# Patient Record
Sex: Male | Born: 2000 | Race: Black or African American | Hispanic: No | Marital: Single | Smoking: Never smoker
Health system: Southern US, Community
[De-identification: ages and names within clinical notes are randomized; demographics above are authoritative.]

## PROBLEM LIST (undated history)

## (undated) DIAGNOSIS — D571 Sickle-cell disease without crisis: Secondary | ICD-10-CM

---

## 2019-02-20 ENCOUNTER — Emergency Department (HOSPITAL_COMMUNITY)
Admission: EM | Admit: 2019-02-20 | Discharge: 2019-02-20 | Disposition: A | Discharge status: Home / Self Care | Attending: Emergency Medicine | Admitting: Emergency Medicine

## 2019-02-20 ENCOUNTER — Emergency Department (HOSPITAL_COMMUNITY)

## 2019-02-20 ENCOUNTER — Other Ambulatory Visit: Payer: Self-pay

## 2019-02-20 ENCOUNTER — Encounter (HOSPITAL_COMMUNITY): Payer: Self-pay | Admitting: Emergency Medicine

## 2019-02-20 DIAGNOSIS — R079 Chest pain, unspecified: Secondary | ICD-10-CM

## 2019-02-20 DIAGNOSIS — D571 Sickle-cell disease without crisis: Secondary | ICD-10-CM | POA: Diagnosis not present

## 2019-02-20 DIAGNOSIS — R0789 Other chest pain: Secondary | ICD-10-CM | POA: Insufficient documentation

## 2019-02-20 HISTORY — DX: Sickle-cell disease without crisis: D57.1

## 2019-02-20 LAB — TROPONIN I (HIGH SENSITIVITY)
Troponin I (High Sensitivity): 2 ng/L (ref <18)
Troponin I (High Sensitivity): 2 ng/L (ref <18)

## 2019-02-20 LAB — BASIC METABOLIC PANEL
Anion gap: 10 (ref 5–15)
BUN: 13 mg/dL (ref 6–20)
CO2: 27 mmol/L (ref 22–32)
Calcium: 9.2 mg/dL (ref 8.9–10.3)
Chloride: 103 mmol/L (ref 98–111)
Creatinine, Ser: 0.9 mg/dL (ref 0.61–1.24)
GFR calc Af Amer: >60 mL/min (ref >60)
GFR calc non Af Amer: >60 mL/min (ref >60)
Glucose, Bld: 95 mg/dL (ref 70–99)
Potassium: 4.3 mmol/L (ref 3.5–5.1)
Sodium: 140 mmol/L (ref 135–145)

## 2019-02-20 LAB — CBC
HCT: 39.5 % (ref 39.0–52.0)
Hemoglobin: 13 g/dL (ref 13.0–17.0)
MCH: 22.1 pg — ABNORMAL LOW (ref 26.0–34.0)
MCHC: 32.9 g/dL (ref 30.0–36.0)
MCV: 67.3 fL — ABNORMAL LOW (ref 80.0–100.0)
Platelets: 168 10*3/uL (ref 150–400)
RBC: 5.87 MIL/uL — ABNORMAL HIGH (ref 4.22–5.81)
RDW: 17.3 % — ABNORMAL HIGH (ref 11.5–15.5)
WBC: 6.8 10*3/uL (ref 4.0–10.5)
nRBC: 0 % (ref 0.0–0.2)

## 2019-02-20 MED ORDER — SODIUM CHLORIDE 0.9% FLUSH: 3.0000 mL | Freq: Once | INTRAVENOUS | Status: DC

## 2019-02-20 NOTE — ED Notes (Signed)
Pt arrives to ED with c/c of CP also reports hx of sickle cell and over 14 days ago was tested + for covid.

## 2019-02-20 NOTE — ED Provider Notes (Signed)
[facility] EMERGENCY DEPARTMENT Provider Note   CSN: [id number] Arrival date & time: 02/20/19  1530     History Chief Complaint  Patient presents with  . Chest Pain    Marcus Ross is a 19 y.o. male.  HPI 19 year old male with chest pain.  Onset yesterday.  He cannot remember to specifically doing when he first noticed it.  The pain is in the center of his chest.  Describes it as sharp.  Sometimes worse with deep breaths and certain movements.  He does not feel short of breath though.  No fevers or chills.  No cough.  No unusual leg pain or swelling.  He has a past history of sickle cell anemia.  Denies any past history of DVT/PE.  Past Medical History:  Diagnosis Date  . Sickle cell anemia (HCC)     No family history on file.  Social History   Tobacco Use  . Smoking status: Not on file  Substance Use Topics  . Alcohol use: Not on file  . Drug use: Not on file   Home Medications Prior to Admission medications   Not on File   Allergies    Patient has no known allergies.  Review of Systems   Review of Systems All systems reviewed and negative, other than as noted in HPI.  Physical Exam Updated Vital Signs BP 122/80   Pulse (!) 54   Temp 98 F (36.7 C) (Oral)   Resp (!) 22   SpO2 100%   Physical Exam Vitals and nursing note reviewed.  Constitutional:      General: He is not in acute distress.    Appearance: He is well-developed.  HENT:     Head: Normocephalic and atraumatic.  Eyes:     General:        Right eye: No discharge.        Left eye: No discharge.     Conjunctiva/sclera: Conjunctivae normal.  Cardiovascular:     Rate and Rhythm: Normal rate and regular rhythm.     Heart sounds: Normal heart sounds. No murmur. No friction rub. No gallop.   Pulmonary:     Effort: Pulmonary effort is normal. No respiratory distress.     Breath sounds: Normal breath sounds.  Abdominal:     General: There is no distension.     Palpations:  Abdomen is soft.     Tenderness: There is no abdominal tenderness.  Musculoskeletal:        General: No tenderness.     Cervical back: Neck supple.  Skin:    General: Skin is warm and dry.  Neurological:     Mental Status: He is alert.  Psychiatric:        Behavior: Behavior normal.        Thought Content: Thought content normal.    ED Results / Procedures / Treatments   Labs (all labs ordered are listed, but only abnormal results are displayed) Labs Reviewed  CBC - Abnormal; Notable for the following components:      Result Value   RBC 5.87 (*)    MCV 67.3 (*)    MCH 22.1 (*)    RDW 17.3 (*)    All other components within normal limits  BASIC METABOLIC PANEL  TROPONIN I (HIGH SENSITIVITY)  TROPONIN I (HIGH SENSITIVITY)   EKG EKG Interpretation  Date/Time:  Tuesday February 20 2019 16:17:09 EST Ventricular Rate:  59 PR Interval:  142 QRS Duration: 86 QT Interval:  410 QTC Calculation: 405 R Axis:   95 Text Interpretation: Sinus bradycardia with marked sinus arrhythmia Rightward axis Borderline ECG Confirmed by Raeford Razor [phone]) on 02/20/2019 4:50:13 PM   Radiology DG Chest 2 View  Result Date: 02/20/2019 CLINICAL DATA:  Chest pain EXAM: CHEST - 2 VIEW COMPARISON:  None. FINDINGS: The heart size and mediastinal contours are within normal limits. Both lungs are clear. The visualized skeletal structures are unremarkable. IMPRESSION: No active cardiopulmonary disease. Electronically Signed   By: Jonna Clark M.D.   On: 02/20/2019 16:28   Procedures Procedures (including critical care time)  Medications Ordered in ED Medications  sodium chloride flush (NS) 0.9 % injection 3 mL (has no administration in time range)   ED Course  I have reviewed the triage vital signs and the nursing notes.  Pertinent labs & imaging results that were available during my care of the patient were reviewed by me and considered in my medical decision making (see chart for details).     MDM Rules/Calculators/A&P                      19 year old male with chest pain.  I suspect that this is musculoskeletal/chest wall pain.  It is reproducible with palpation and certain movements.  I do not think that this is acute chest or other emergent condition related to his sickle cell anemia or otherwise.  Very atypical for ACS.  Doubt PE.  He has no acute respiratory complaints.  He sounds clear on exam.  O2 sats are normal on room air.  His chest x-ray is clear.  Labs are unremarkable.  Advised taking NSAIDs as needed for discomfort.  Discussed results with his mother as well via the telephone.  Emergent return precautions were discussed with both of them.  Outpatient follow-up otherwise.  Final Clinical Impression(s) / ED Diagnoses Final diagnoses:  Chest pain, unspecified type    Rx / DC Orders ED Discharge Orders    None       Raeford Razor, MD 02/25/19 1423

## 2019-06-01 ENCOUNTER — Emergency Department (HOSPITAL_COMMUNITY)

## 2019-06-01 ENCOUNTER — Encounter (HOSPITAL_COMMUNITY): Payer: Self-pay | Admitting: Emergency Medicine

## 2019-06-01 ENCOUNTER — Other Ambulatory Visit: Payer: Self-pay

## 2019-06-01 ENCOUNTER — Emergency Department (HOSPITAL_COMMUNITY)
Admission: EM | Admit: 2019-06-01 | Discharge: 2019-06-01 | Disposition: A | Discharge status: Home / Self Care | Attending: Emergency Medicine | Admitting: Emergency Medicine

## 2019-06-01 DIAGNOSIS — R1012 Left upper quadrant pain: Secondary | ICD-10-CM | POA: Diagnosis present

## 2019-06-01 DIAGNOSIS — D571 Sickle-cell disease without crisis: Secondary | ICD-10-CM | POA: Insufficient documentation

## 2019-06-01 DIAGNOSIS — D735 Infarction of spleen: Secondary | ICD-10-CM | POA: Insufficient documentation

## 2019-06-01 LAB — COMPREHENSIVE METABOLIC PANEL
ALT: 72 U/L — ABNORMAL HIGH (ref 0–44)
AST: 75 U/L — ABNORMAL HIGH (ref 15–41)
Albumin: 4.7 g/dL (ref 3.5–5.0)
Alkaline Phosphatase: 60 U/L (ref 38–126)
Anion gap: 16 — ABNORMAL HIGH (ref 5–15)
BUN: 16 mg/dL (ref 6–20)
CO2: 20 mmol/L — ABNORMAL LOW (ref 22–32)
Calcium: 9.6 mg/dL (ref 8.9–10.3)
Chloride: 107 mmol/L (ref 98–111)
Creatinine, Ser: 0.9 mg/dL (ref 0.61–1.24)
GFR calc Af Amer: >60 mL/min (ref >60)
GFR calc non Af Amer: >60 mL/min (ref >60)
Glucose, Bld: 61 mg/dL — ABNORMAL LOW (ref 70–99)
Potassium: 3.9 mmol/L (ref 3.5–5.1)
Sodium: 143 mmol/L (ref 135–145)
Total Bilirubin: 1.4 mg/dL — ABNORMAL HIGH (ref 0.3–1.2)
Total Protein: 7.8 g/dL (ref 6.5–8.1)

## 2019-06-01 LAB — URINALYSIS, ROUTINE W REFLEX MICROSCOPIC
Bilirubin Urine: NEGATIVE
Glucose, UA: NEGATIVE mg/dL
Hgb urine dipstick: NEGATIVE
Ketones, ur: 5 mg/dL — AB
Leukocytes,Ua: NEGATIVE
Nitrite: NEGATIVE
Protein, ur: NEGATIVE mg/dL
Specific Gravity, Urine: 1.014 (ref 1.005–1.030)
pH: 5 (ref 5.0–8.0)

## 2019-06-01 LAB — LIPASE, BLOOD: Lipase: 22 U/L (ref 11–51)

## 2019-06-01 LAB — CBC
HCT: 43.4 % (ref 39.0–52.0)
Hemoglobin: 14.6 g/dL (ref 13.0–17.0)
MCH: 22.9 pg — ABNORMAL LOW (ref 26.0–34.0)
MCHC: 33.6 g/dL (ref 30.0–36.0)
MCV: 68.1 fL — ABNORMAL LOW (ref 80.0–100.0)
Platelets: 165 10*3/uL (ref 150–400)
RBC: 6.37 MIL/uL — ABNORMAL HIGH (ref 4.22–5.81)
RDW: 17.2 % — ABNORMAL HIGH (ref 11.5–15.5)
WBC: 8.6 10*3/uL (ref 4.0–10.5)
nRBC: 0 % (ref 0.0–0.2)

## 2019-06-01 MED ORDER — ALUM & MAG HYDROXIDE-SIMETH 200-200-20 MG/5ML PO SUSP
30.0000 mL | Freq: Once | ORAL | Status: AC
  Administered 2019-06-01: 30 mL via ORAL
  Filled 2019-06-01: qty 30

## 2019-06-01 MED ORDER — LIDOCAINE VISCOUS HCL 2 % MT SOLN
15.0000 mL | Freq: Once | OROMUCOSAL | Status: AC
  Administered 2019-06-01: 15 mL via ORAL
  Filled 2019-06-01: qty 15

## 2019-06-01 MED ORDER — KETOROLAC TROMETHAMINE 15 MG/ML IJ SOLN
15.0000 mg | Freq: Once | INTRAMUSCULAR | Status: AC
  Administered 2019-06-01: 15 mg via INTRAVENOUS
  Filled 2019-06-01: qty 1

## 2019-06-01 MED ORDER — KETOROLAC TROMETHAMINE 60 MG/2ML IM SOLN
60.0000 mg | Freq: Once | INTRAMUSCULAR | Status: DC
  Filled 2019-06-01: qty 2

## 2019-06-01 MED ORDER — IOHEXOL 300 MG/ML  SOLN
100.0000 mL | Freq: Once | INTRAMUSCULAR | Status: AC | PRN
  Administered 2019-06-01: 100 mL via INTRAVENOUS

## 2019-06-01 MED ORDER — NAPROXEN 500 MG PO TABS: 500.0000 mg | ORAL_TABLET | Freq: Two times a day (BID) | ORAL | 0 refills | Status: DC

## 2019-06-01 NOTE — Discharge Instructions (Addendum)
You are seen today for abdominal pain.  He looks like there is a small splenic infarct on your CT scan causing your pain.  Otherwise your labs are reassuring.  Follow-up with your hematologist on Monday as scheduled.  Thank you for allowing me to care for you today. Please return to the emergency department if you have new or worsening symptoms. Take your medications as instructed.

## 2019-06-01 NOTE — ED Notes (Signed)
Called pt name x3 for VS recheck. No response from pt. Previously pt family member came and said she was going to take him to another facility. Encouraged to stay and get seen here.

## 2019-06-01 NOTE — ED Notes (Signed)
Carmel Sacramento is the pt's hematologist.

## 2019-06-01 NOTE — ED Triage Notes (Signed)
Patient c/o left upper abdominal pain/ below rib cage pain onset of Tuesday. Pain worse with deep inspiration. Reports left shoulder pain intermittently. Denies any chest pain.

## 2019-06-01 NOTE — ED Provider Notes (Addendum)
[facility] EMERGENCY DEPARTMENT Provider Note   CSN: [id number] Arrival date & time: 06/01/19  5573     History Chief Complaint  Patient presents with  . Abdominal Pain    Left upper    Marcus Ross is a 19 y.o. male.  19 year old male with past medical history of sickle cell anemia presenting to the emergency department for left upper quadrant pain for the past 3 or 4 days.  Pain is worse with deep breaths and palpation.  He is eating and drinking normally.  Denies any chest pain, shortness of breath, nausea, vomiting.  The pain occasionally radiates to the left shoulder.  He reports it feels like his spleen.  He does not have frequent pain crisis he is and does not regularly take medication for his sickle cell.        Past Medical History:  Diagnosis Date  . Sickle cell anemia (HCC)     There are no problems to display for this patient.   History reviewed. No pertinent surgical history.     No family history on file.  Social History   Tobacco Use  . Smoking status: Never Smoker  Substance Use Topics  . Alcohol use: Yes  . Drug use: Not on file    Home Medications Prior to Admission medications   Medication Sig Start Date End Date Taking? Authorizing Provider  naproxen (NAPROSYN) 500 MG tablet Take 1 tablet (500 mg total) by mouth 2 (two) times daily. 06/01/19   Alveria Apley, PA-C    Allergies    Patient has no known allergies.  Review of Systems   Review of Systems  Constitutional: Negative for chills and fever.  HENT: Negative for congestion, sore throat and tinnitus.   Respiratory: Negative for cough and shortness of breath.   Cardiovascular: Negative for chest pain.  Gastrointestinal: Positive for abdominal pain. Negative for constipation, diarrhea, nausea and rectal pain.  Genitourinary: Negative for dysuria.  Musculoskeletal: Positive for arthralgias.  Neurological: Negative for dizziness, weakness and light-headedness.     Physical Exam Updated Vital Signs BP 128/70 (BP Location: Left Arm)   Pulse 69   Temp 98.4 F (36.9 C) (Oral)   Resp 18   Ht 5\' 9"  (1.753 m)   Wt 63.5 kg   SpO2 99%   BMI 20.67 kg/m   Physical Exam Vitals and nursing note reviewed.  Constitutional:      Appearance: Normal appearance.  HENT:     Head: Normocephalic.  Eyes:     Conjunctiva/sclera: Conjunctivae normal.  Pulmonary:     Effort: Pulmonary effort is normal.  Abdominal:     Palpations: There is no hepatomegaly or splenomegaly.     Tenderness: There is abdominal tenderness in the left upper quadrant. There is no right CVA tenderness, left CVA tenderness, guarding or rebound.  Skin:    General: Skin is dry.  Neurological:     Mental Status: He is alert.  Psychiatric:        Mood and Affect: Mood normal.     ED Results / Procedures / Treatments   Labs (all labs ordered are listed, but only abnormal results are displayed) Labs Reviewed  COMPREHENSIVE METABOLIC PANEL - Abnormal; Notable for the following components:      Result Value   CO2 20 (*)    Glucose, Bld 61 (*)    AST 75 (*)    ALT 72 (*)    Total Bilirubin 1.4 (*)    Anion  gap 16 (*)    All other components within normal limits  CBC - Abnormal; Notable for the following components:   RBC 6.37 (*)    MCV 68.1 (*)    MCH 22.9 (*)    RDW 17.2 (*)    All other components within normal limits  URINALYSIS, ROUTINE W REFLEX MICROSCOPIC - Abnormal; Notable for the following components:   Ketones, ur 5 (*)    All other components within normal limits  LIPASE, BLOOD    EKG EKG Interpretation  Date/Time:  Friday Jun 01 2019 08:28:34 EDT Ventricular Rate:  67 PR Interval:  132 QRS Duration: 90 QT Interval:  418 QTC Calculation: 441 R Axis:   91 Text Interpretation: Sinus rhythm with marked sinus arrhythmia Rightward axis No significant change since last tracing Confirmed by Gwyneth Sprout ([id number]) on 06/01/2019 12:21:13 PM   Radiology DG  Chest 2 View  Result Date: 06/01/2019 CLINICAL DATA:  Left lower rib pain EXAM: CHEST - 2 VIEW COMPARISON:  02/20/2019 FINDINGS: The heart size and mediastinal contours are within normal limits. Both lungs are clear. The visualized skeletal structures are unremarkable. IMPRESSION: No active cardiopulmonary disease. Electronically Signed   By: Elige Ko   On: 06/01/2019 12:00   CT ABDOMEN PELVIS W CONTRAST  Result Date: 06/01/2019 CLINICAL DATA:  Left upper quadrant abdominal pain. Sickle cell disorder. EXAM: CT ABDOMEN AND PELVIS WITH CONTRAST TECHNIQUE: Multidetector CT imaging of the abdomen and pelvis was performed using the standard protocol following bolus administration of intravenous contrast. CONTRAST:  OMNIPAQUE IOHEXOL 300 MG/ML  SOLN COMPARISON:  None. FINDINGS: Lower chest: Clear lung bases. No significant pleural or pericardial effusion. Hepatobiliary: The liver is normal in density without suspicious focal abnormality. No evidence of gallstones, gallbladder wall thickening or biliary dilatation. Pancreas: Unremarkable. No pancreatic ductal dilatation or surrounding inflammatory changes. Spleen: Measures 11.9 x 6.0 x 8.1 cm (volume = 300 cm^3), within normal limits for size. There is a small peripheral low-density area along the inferior aspect of the spleen which is most obvious on axial images 22 through 24 of series 3. Greatest dimension is approximately 3.0 cm on image 91/7. In this clinical context, this likely represents a small splenic infarct. No surrounding inflammatory changes. The spleen otherwise appears normal without calcifications. Adrenals/Urinary Tract: Both adrenal glands appear normal. The kidneys appear normal without evidence of urinary tract calculus, suspicious lesion or hydronephrosis. No bladder abnormalities are seen. Stomach/Bowel: No evidence of bowel wall thickening, distention or surrounding inflammatory change. Moderate stool throughout the colon. The  appendix is not well visualized. Vascular/Lymphatic: There are no enlarged abdominal or pelvic lymph nodes. No significant vascular findings. Circumaortic left renal vein noted incidentally. Reproductive: The prostate gland and seminal vesicles appear normal. Other: No ascites or free air. Musculoskeletal: No acute or significant osseous findings. No evidence of bone infarct. IMPRESSION: 1. 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 No splenomegaly or surrounding inflammatory changes. 2. No other significant findings.  No free peritoneal fluid. Electronically Signed   By: Carey Bullocks M.D.   On: 06/01/2019 16:08    Procedures Procedures (including critical care time)  Medications Ordered in ED Medications  alum & mag hydroxide-simeth (MAALOX/MYLANTA) 200-200-20 MG/5ML suspension 30 mL (30 mLs Oral Given 06/01/19 1222)    And  lidocaine (XYLOCAINE) 2 % viscous mouth solution 15 mL (15 mLs Oral Given 06/01/19 1222)  iohexol (OMNIPAQUE) 300 MG/ML solution 100 mL (100 mLs Intravenous Contrast Given 06/01/19 1525)  ketorolac (TORADOL) 15 MG/ML injection  15 mg (15 mg Intravenous Given 06/01/19 1636)    ED Course  I have reviewed the triage vital signs and the nursing notes.  Pertinent labs & imaging results that were available during my care of the patient were reviewed by me and considered in my medical decision making (see chart for details).  Clinical Course as of Jun 01 2106  Fri Jun 01, 2019  1621 Sickle cell patient with LUQ pain for 4 days. Overall well appearing, normal hemoglobin. Small splenic infarct on CT scan which is likely the cause of his pain.  Discussed with patient and his mother at bedside.  Will treat with NSAIDs and he has hematology follow-up on Monday. Advised on return precaution    [KM]    Clinical Course User Index [KM] Jeral Pinch   MDM Rules/Calculators/A&P                      Based on review of vitals, medical screening exam, lab work and/or imaging,  there does not appear to be an acute, emergent etiology for the patient's symptoms. Counseled pt on good return precautions and encouraged both PCP and ED follow-up as needed.  Prior to discharge, I also discussed incidental imaging findings with patient in detail and advised appropriate, recommended follow-up in detail.  Clinical Impression: 1. 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 The patient had no recent or regular prescriptions and was deemed appropriate for a brief, less than 3 day prescription of narcotic for acute analgesia.  This note was prepared with assistance of Conservation officer, historic buildings. Occasional wrong-word or sound-a-like substitutions may have occurred due to the inherent limitations of voice recognition software.  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

## 2019-06-01 NOTE — ED Notes (Signed)
Pt ambulatory to restroom now 

## 2020-12-17 ENCOUNTER — Ambulatory Visit (HOSPITAL_COMMUNITY)
Admission: EM | Admit: 2020-12-17 | Discharge: 2020-12-17 | Disposition: A | Discharge status: Home / Self Care | Attending: Urgent Care | Admitting: Urgent Care

## 2020-12-17 ENCOUNTER — Encounter (HOSPITAL_COMMUNITY): Payer: Self-pay | Admitting: *Deleted

## 2020-12-17 ENCOUNTER — Other Ambulatory Visit: Payer: Self-pay

## 2020-12-17 DIAGNOSIS — K219 Gastro-esophageal reflux disease without esophagitis: Secondary | ICD-10-CM

## 2020-12-17 DIAGNOSIS — K529 Noninfective gastroenteritis and colitis, unspecified: Secondary | ICD-10-CM

## 2020-12-17 DIAGNOSIS — R1010 Upper abdominal pain, unspecified: Secondary | ICD-10-CM

## 2020-12-17 DIAGNOSIS — R112 Nausea with vomiting, unspecified: Secondary | ICD-10-CM

## 2020-12-17 MED ORDER — FAMOTIDINE 20 MG PO TABS: 20.0000 mg | ORAL_TABLET | Freq: Two times a day (BID) | ORAL | 0 refills | Status: AC

## 2020-12-17 MED ORDER — ONDANSETRON 8 MG PO TBDP: 8.0000 mg | ORAL_TABLET | Freq: Three times a day (TID) | ORAL | 0 refills | Status: DC | PRN

## 2020-12-17 NOTE — Discharge Instructions (Signed)
Make sure you push fluids drinking mostly water but mix it with Gatorade.  Try to eat light meals including soups, broths and soft foods, fruits.  You may use Zofran for your nausea and vomiting once every 8 hours.  Imodium can help with diarrhea but use this carefully limiting it to 1-2 times per day only if you are having a lot of diarrhea.  Please return to the clinic if symptoms worsen or you start having severe abdominal pain not helped by taking Tylenol or start having bloody stools or blood in the vomit.  For you acid reflux continue taking omeprazole. I am prescribing famotidine to use with this as well.

## 2020-12-17 NOTE — ED Triage Notes (Signed)
Pt reports ABD pain and vomiting since last night

## 2020-12-17 NOTE — ED Provider Notes (Signed)
[facility]   MRN: [id number] DOB: 05/02/2000  Subjective:   Marcus Ross is a 20 y.o. male presenting for 1 day history of acute onset nausea with vomiting, upper abdominal pain. States that this is consistent with his acid reflux. No fever, hematemesis, bloody stools, diarrhea, cough, chest pain, shob, body aches. Needs a note for work.  No recent antibiotic use, hospitalizations or long distance travel outside the country.  Has get dietary routine the same.  No current facility-administered medications for this encounter.  Current Outpatient Medications:    naproxen (NAPROSYN) 500 MG tablet, Take 1 tablet (500 mg total) by mouth 2 (two) times daily., Disp: 30 tablet, Rfl: 0   No Known Allergies  Past Medical History:  Diagnosis Date   Sickle cell anemia (HCC)      History reviewed. No pertinent surgical history.  History reviewed. No pertinent family history.  Social History   Tobacco Use   Smoking status: Never  Substance Use Topics   Alcohol use: Yes    ROS   Objective:   Vitals: BP 107/71   Pulse (!) 52   Temp 98.3 F (36.8 C)   Resp 20   SpO2 100%   Physical Exam Constitutional:      General: He is not in acute distress.    Appearance: Normal appearance. He is well-developed and normal weight. He is not ill-appearing, toxic-appearing or diaphoretic.  HENT:     Head: Normocephalic and atraumatic.     Right Ear: External ear normal.     Left Ear: External ear normal.     Nose: Nose normal.     Mouth/Throat:     Mouth: Mucous membranes are moist.     Pharynx: Oropharynx is clear.  Eyes:     General: No scleral icterus.       Right eye: No discharge.        Left eye: No discharge.     Extraocular Movements: Extraocular movements intact.     Pupils: Pupils are equal, round, and reactive to light.  Cardiovascular:     Rate and Rhythm: Normal rate and regular rhythm.     Heart sounds: Normal heart sounds. No murmur heard.   No  friction rub. No gallop.  Pulmonary:     Effort: Pulmonary effort is normal. No respiratory distress.     Breath sounds: Normal breath sounds. No stridor. No wheezing, rhonchi or rales.  Abdominal:     General: Bowel sounds are normal. There is no distension.     Palpations: Abdomen is soft. There is no mass.     Tenderness: There is no abdominal tenderness. There is no right CVA tenderness, left CVA tenderness, guarding or rebound.  Musculoskeletal:     Cervical back: Normal range of motion.  Neurological:     Mental Status: He is alert and oriented to person, place, and time.  Psychiatric:        Mood and Affect: Mood normal.        Behavior: Behavior normal.        Thought Content: Thought content normal.        Judgment: Judgment normal.    Assessment and Plan :   PDMP not reviewed this encounter.  1. Gastroenteritis   2. Nausea and vomiting, unspecified vomiting type   3. Pain of upper abdomen   4. Gastroesophageal reflux disease, unspecified whether esophagitis present    Will manage for suspected viral gastroenteritis with supportive care.  Recommended patient hydrate well, eat light meals and maintain electrolytes.  Will use Zofran for nausea, vomiting. Maintain omeprazole, add famotidine.  Counseled patient on potential for adverse effects with medications prescribed/recommended today, ER and return-to-clinic precautions discussed, patient verbalized understanding.    Wallis Bamberg, PA-C 12/17/20 1124

## 2021-09-10 IMAGING — CT CT ABD-PELV W/ CM
2 of 4 series · 15 of 46 positions shown, 17 images · IV contrast (omnipaque)
Comparison: None.

CLINICAL DATA: Left upper quadrant abdominal pain. Sickle cell
disorder.

EXAM:
CT ABDOMEN AND PELVIS WITH CONTRAST
TECHNIQUE: Multidetector CT imaging of the abdomen and pelvis was performed
using the standard protocol following bolus administration of
intravenous contrast.
CONTRAST:  100mL OMNIPAQUE IOHEXOL 300 MG/ML  SOLN

[Series 3: abdomen 5.0 · axial · 0.62mm/px · z∈[+747,+1112]mm · 12 of 83 slices shown, 14 images]
[im 5/83  soft-tissue]
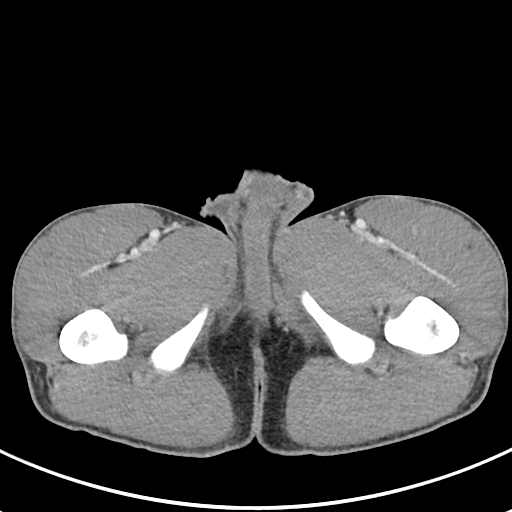
[im 5/83  bone]
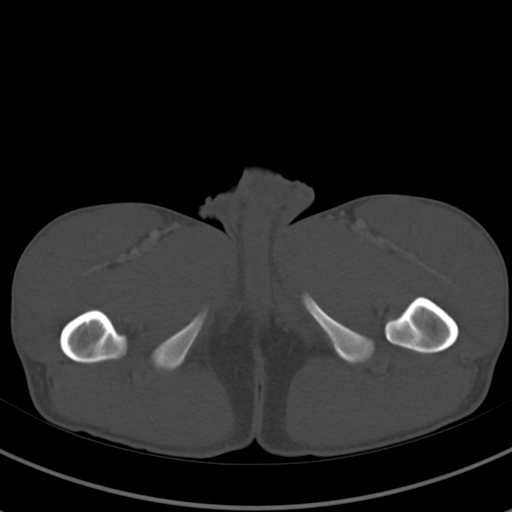
[im 15/83  soft-tissue]
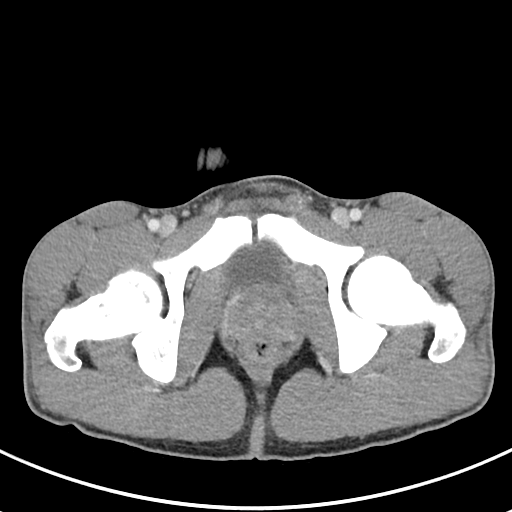
[im 20/83  soft-tissue]
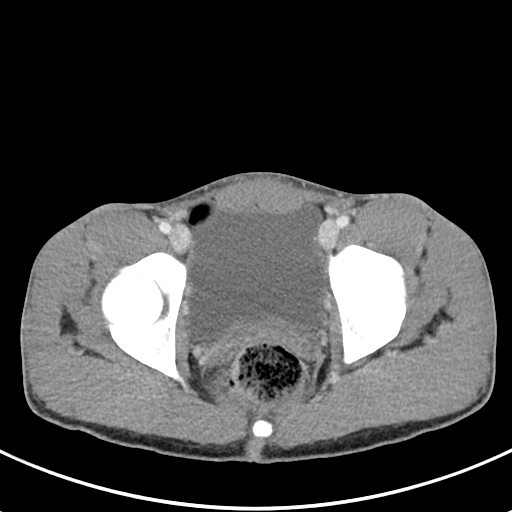
[im 25/83  soft-tissue]
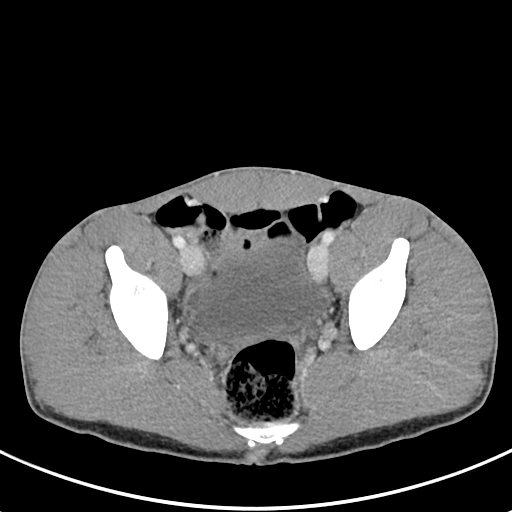
[im 34/83  soft-tissue]
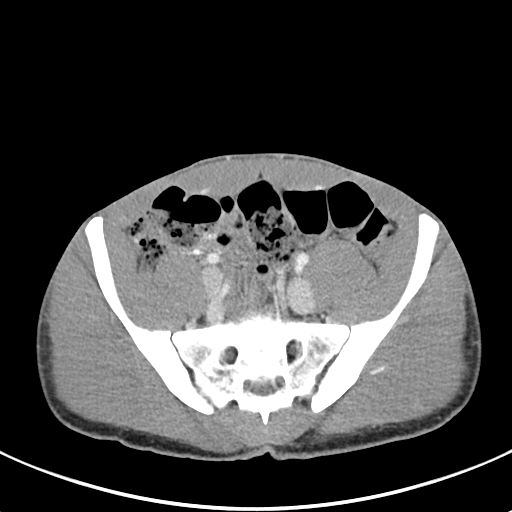
[im 39/83  soft-tissue]
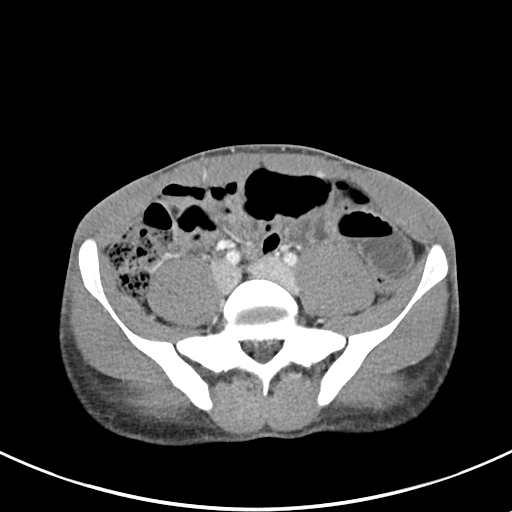
[im 44/83  soft-tissue]
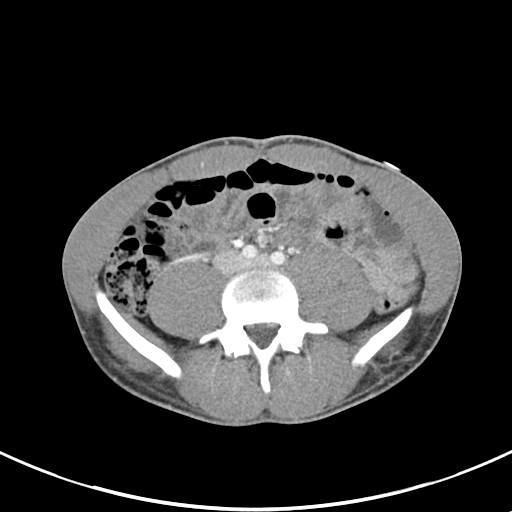
[im 54/83  soft-tissue]
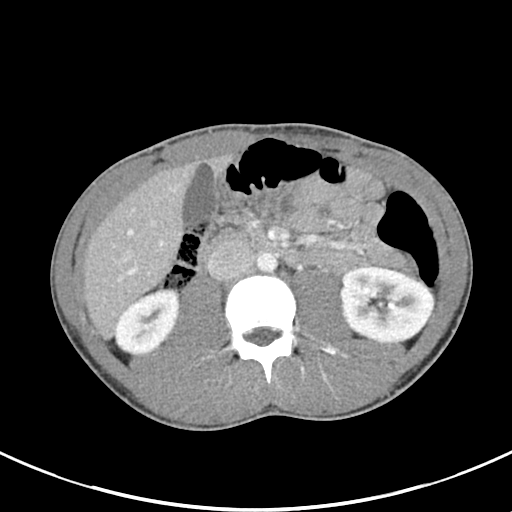
[im 58/83  soft-tissue]
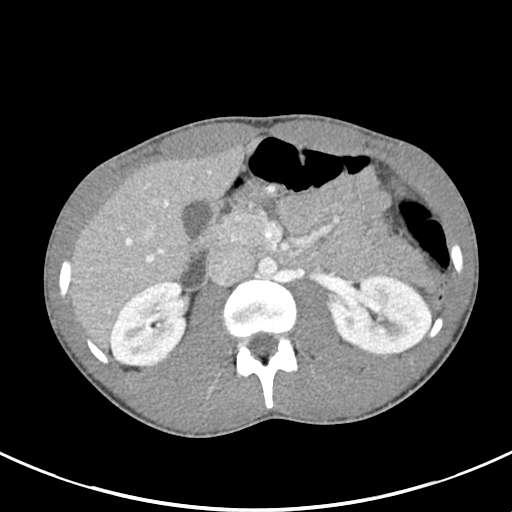
[im 58/83  bone]
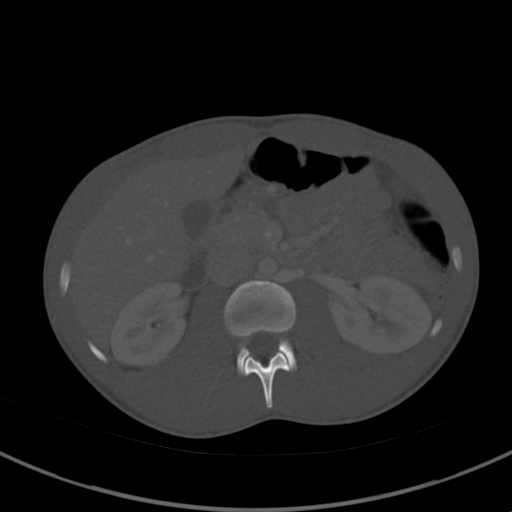
[im 63/83  soft-tissue]
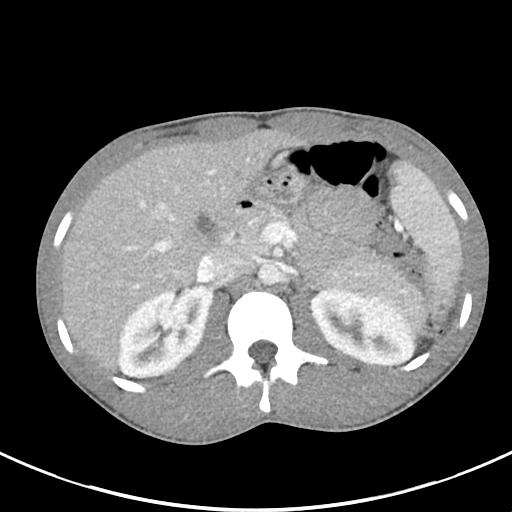
[im 73/83  soft-tissue]
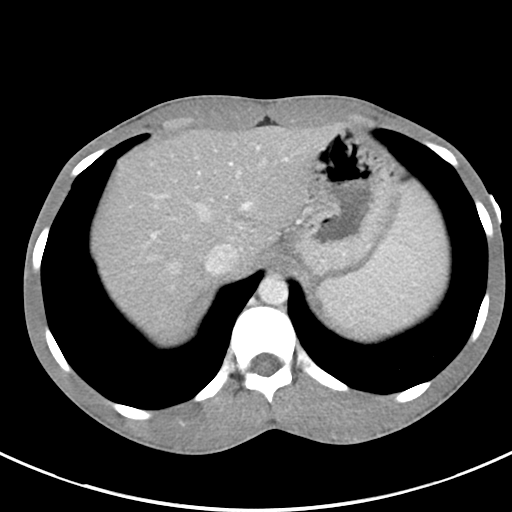
[im 78/83  soft-tissue]
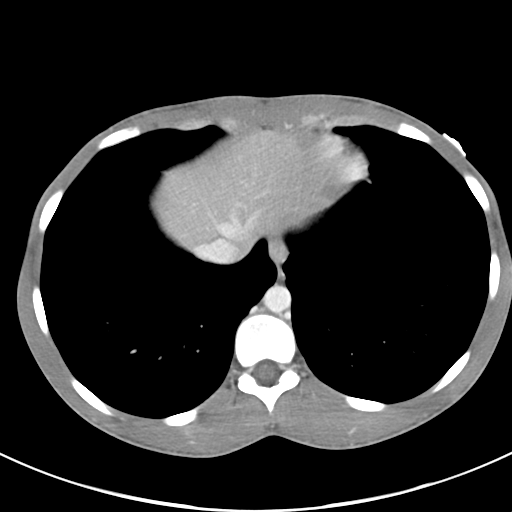

[Series 6: abdomen 3.0 mpr cor · coronal · 0.60mm/px · 3 of 77 slices shown]
[im 26/77  soft-tissue]
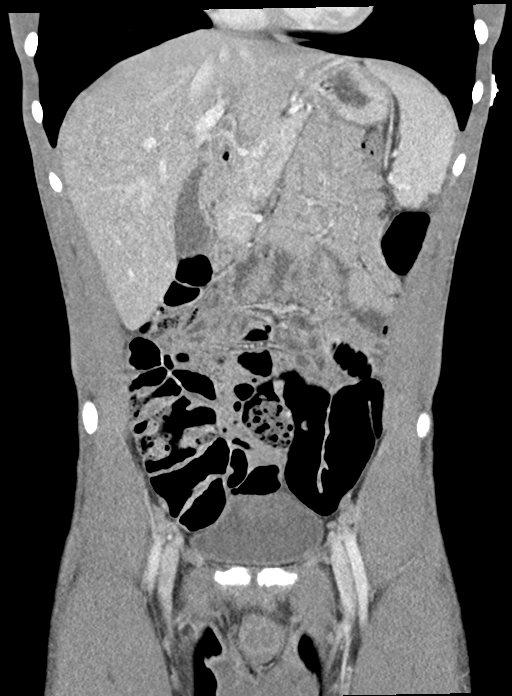
[im 34/77  soft-tissue]
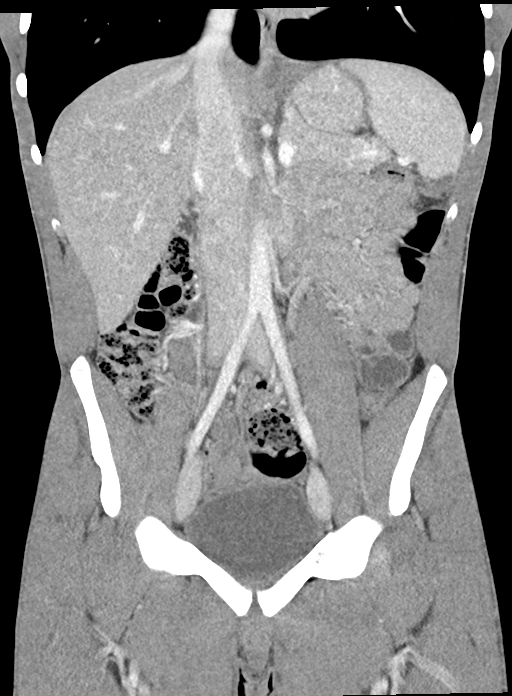
[im 43/77  soft-tissue]
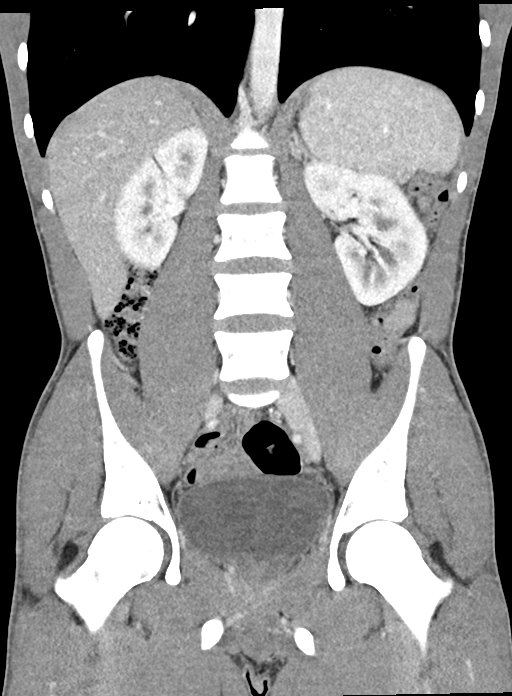

[15 of 46 positions shown; findings below may reference images not displayed]

FINDINGS: Lower chest: Clear lung bases. No significant pleural or pericardial
effusion.

Hepatobiliary: The liver is normal in density without suspicious
focal abnormality. No evidence of gallstones, gallbladder wall
thickening or biliary dilatation.

Pancreas: Unremarkable. No pancreatic ductal dilatation or
surrounding inflammatory changes.

Spleen: Measures 11.9 x 6.0 x 8.1 cm (volume = 300 cm^3), within
normal limits for size. There is a small peripheral low-density area
along the inferior aspect of the spleen which is most obvious on
axial images 22 through 24 of series 3. Greatest dimension is
approximately 3.0 cm on image 91/7. In this clinical context, this
likely represents a small splenic infarct. No surrounding
inflammatory changes. The spleen otherwise appears normal without
calcifications.

Adrenals/Urinary Tract: Both adrenal glands appear normal. The
kidneys appear normal without evidence of urinary tract calculus,
suspicious lesion or hydronephrosis. No bladder abnormalities are
seen.

Stomach/Bowel: No evidence of bowel wall thickening, distention or
surrounding inflammatory change. Moderate stool throughout the
colon. The appendix is not well visualized.

Vascular/Lymphatic: There are no enlarged abdominal or pelvic lymph
nodes. No significant vascular findings. Circumaortic left renal
vein noted incidentally.

Reproductive: The prostate gland and seminal vesicles appear normal.

Other: No ascites or free air.

Musculoskeletal: No acute or significant osseous findings. No
evidence of bone infarct.
IMPRESSION: 1. Probable small splenic infarct inferiorly. No splenomegaly or
surrounding inflammatory changes.
2. No other significant findings.  No free peritoneal fluid.

## 2022-03-09 ENCOUNTER — Encounter (HOSPITAL_COMMUNITY): Payer: Self-pay

## 2022-03-09 ENCOUNTER — Ambulatory Visit (HOSPITAL_COMMUNITY)
Admission: EM | Admit: 2022-03-09 | Discharge: 2022-03-09 | Disposition: A | Discharge status: Home / Self Care | Payer: No Typology Code available for payment source | Attending: Emergency Medicine | Admitting: Emergency Medicine

## 2022-03-09 DIAGNOSIS — Z113 Encounter for screening for infections with a predominantly sexual mode of transmission: Secondary | ICD-10-CM | POA: Insufficient documentation

## 2022-03-09 LAB — HIV ANTIBODY (ROUTINE TESTING W REFLEX): HIV Screen 4th Generation wRfx: NONREACTIVE

## 2022-03-09 NOTE — ED Provider Notes (Signed)
[place]    CSN: [id number] Arrival date & time: 03/09/22  1515      History   Chief Complaint Chief Complaint  Patient presents with   Exposure to STD    HPI Marcus Ross is a 22 y.o. male.  Presents for STD testing He denies any known exposures or symptoms. Would like blood work today  Past Medical History:  Diagnosis Date   Sickle cell anemia ([place])     There are no problems to display for this patient.   History reviewed. No pertinent surgical history.     Home Medications    Prior to Admission medications   Medication Sig Start Date End Date Taking? Authorizing Provider  famotidine (PEPCID) 20 MG tablet Take 1 tablet (20 mg total) by mouth 2 (two) times daily. 12/17/20  Yes Jaynee Eagles, PA-C    Family History History reviewed. No pertinent family history.  Social History Social History   Tobacco Use   Smoking status: Never  Substance Use Topics   Alcohol use: Yes     Allergies   Patient has no known allergies.   Review of Systems Review of Systems As per HPI  Physical Exam Triage Vital Signs ED Triage Vitals [03/09/22 1548]  Enc Vitals Group     BP 121/77     Pulse Rate 62     Resp 16     Temp 98.1 F (36.7 C)     Temp Source Oral     SpO2 98 %     Weight      Height      Head Circumference      Peak Flow      Pain Score 0     Pain Loc      Pain Edu?      Excl. in [place]?    No data found.  Updated Vital Signs BP 121/77 (BP Location: Right Arm)   Pulse 62   Temp 98.1 F (36.7 C) (Oral)   Resp 16   SpO2 98%    Physical Exam Vitals and nursing note reviewed.  Constitutional:      General: He is not in acute distress.    Appearance: Normal appearance.  HENT:     Mouth/Throat:     Pharynx: Oropharynx is clear.  Cardiovascular:     Rate and Rhythm: Normal rate and regular rhythm.     Pulses: Normal pulses.  Pulmonary:     Effort: Pulmonary effort is normal.  Neurological:     Mental Status: He is  alert and oriented to person, place, and time.      UC Treatments / Results  Labs (all labs ordered are listed, but only abnormal results are displayed) Labs Reviewed  RPR  HIV ANTIBODY (ROUTINE TESTING W REFLEX)  CYTOLOGY, (ORAL, ANAL, URETHRAL) ANCILLARY ONLY    EKG   Radiology No results found.  Procedures Procedures (including critical care time)  Medications Ordered in UC Medications - No data to display  Initial Impression / Assessment and Plan / UC Course  I have reviewed the triage vital signs and the nursing notes.  Pertinent labs & imaging results that were available during my care of the patient were reviewed by me and considered in my medical decision making (see chart for details).  Cytology swab, HIV/RPR pending. Discussed treat positive result if indicated. No questions at this time  Final Clinical Impressions(s) / UC Diagnoses   Final diagnoses:  Screen for STD (sexually transmitted disease)  Discharge Instructions      We will call you if anything on your swab or blood work returns positive. Please abstain from sexual intercourse until your results return.  You can also see results on mychart    ED Prescriptions   None    PDMP not reviewed this encounter.   Christina Gintz, [place], [place] 03/09/22 1725

## 2022-03-09 NOTE — ED Triage Notes (Signed)
Pt would like STD check and labs as a preventive

## 2022-03-09 NOTE — Discharge Instructions (Addendum)
We will call you if anything on your swab or blood work returns positive. Please abstain from sexual intercourse until your results return.  You can also see results on mychart

## 2022-03-10 ENCOUNTER — Telehealth (HOSPITAL_COMMUNITY): Payer: Self-pay | Admitting: Emergency Medicine

## 2022-03-10 LAB — CYTOLOGY, (ORAL, ANAL, URETHRAL) ANCILLARY ONLY
Chlamydia: NEGATIVE
Comment: NEGATIVE
Comment: NEGATIVE
Comment: NORMAL
Neisseria Gonorrhea: POSITIVE — AB
Trichomonas: NEGATIVE

## 2022-03-10 LAB — RPR: RPR Ser Ql: NONREACTIVE

## 2022-03-10 NOTE — Telephone Encounter (Signed)
Per protocol, patient will need treatment with IM Rocephin 533m for positive Gonorrhea Attempted to reach patient x 1, unable to LVM [facility] notified

## 2022-03-13 ENCOUNTER — Ambulatory Visit (HOSPITAL_COMMUNITY)
Admission: EM | Admit: 2022-03-13 | Discharge: 2022-03-13 | Disposition: A | Discharge status: Home / Self Care | Payer: No Typology Code available for payment source | Attending: Internal Medicine | Admitting: Internal Medicine

## 2022-03-13 ENCOUNTER — Encounter (HOSPITAL_COMMUNITY): Payer: Self-pay

## 2022-03-13 DIAGNOSIS — A549 Gonococcal infection, unspecified: Secondary | ICD-10-CM

## 2022-03-13 MED ORDER — CEFTRIAXONE SODIUM 500 MG IJ SOLR
500.0000 mg | Freq: Once | INTRAMUSCULAR | Status: AC
  Administered 2022-03-13: 500 mg via INTRAMUSCULAR

## 2022-03-13 MED ORDER — CEFTRIAXONE SODIUM 500 MG IJ SOLR
INTRAMUSCULAR | Status: AC
  Filled 2022-03-13: qty 500

## 2022-03-13 MED ORDER — LIDOCAINE HCL (PF) 1 % IJ SOLN
INTRAMUSCULAR | Status: AC
  Filled 2022-03-13: qty 2

## 2022-03-13 NOTE — ED Triage Notes (Signed)
Pt is here for treatment for gonorrhea

## 2022-05-27 ENCOUNTER — Emergency Department (HOSPITAL_COMMUNITY)
Admission: EM | Admit: 2022-05-27 | Discharge: 2022-05-27 | Disposition: A | Discharge status: Home / Self Care | Payer: No Typology Code available for payment source | Attending: Emergency Medicine | Admitting: Emergency Medicine

## 2022-05-27 ENCOUNTER — Emergency Department (HOSPITAL_COMMUNITY): Payer: No Typology Code available for payment source

## 2022-05-27 DIAGNOSIS — R0602 Shortness of breath: Secondary | ICD-10-CM | POA: Diagnosis present

## 2022-05-27 DIAGNOSIS — D571 Sickle-cell disease without crisis: Secondary | ICD-10-CM | POA: Insufficient documentation

## 2022-05-27 LAB — CBC WITH DIFFERENTIAL/PLATELET
Abs Immature Granulocytes: 0.02 10*3/uL (ref 0.00–0.07)
Basophils Absolute: 0 10*3/uL (ref 0.0–0.1)
Basophils Relative: 1 %
Eosinophils Absolute: 0.2 10*3/uL (ref 0.0–0.5)
Eosinophils Relative: 3 %
HCT: 38.8 % — ABNORMAL LOW (ref 39.0–52.0)
Hemoglobin: 13.3 g/dL (ref 13.0–17.0)
Immature Granulocytes: 0 %
Lymphocytes Relative: 26 %
Lymphs Abs: 1.6 10*3/uL (ref 0.7–4.0)
MCH: 22.9 pg — ABNORMAL LOW (ref 26.0–34.0)
MCHC: 34.3 g/dL (ref 30.0–36.0)
MCV: 66.9 fL — ABNORMAL LOW (ref 80.0–100.0)
Monocytes Absolute: 0.5 10*3/uL (ref 0.1–1.0)
Monocytes Relative: 9 %
Neutro Abs: 3.8 10*3/uL (ref 1.7–7.7)
Neutrophils Relative %: 61 %
Platelets: 133 10*3/uL — ABNORMAL LOW (ref 150–400)
RBC: 5.8 MIL/uL (ref 4.22–5.81)
RDW: 16.5 % — ABNORMAL HIGH (ref 11.5–15.5)
WBC: 6.2 10*3/uL (ref 4.0–10.5)
nRBC: 0 % (ref 0.0–0.2)

## 2022-05-27 LAB — RETICULOCYTES
Immature Retic Fract: 16.7 % — ABNORMAL HIGH (ref 2.3–15.9)
RBC.: 5.69 MIL/uL (ref 4.22–5.81)
Retic Count, Absolute: 80.2 10*3/uL (ref 19.0–186.0)
Retic Ct Pct: 1.4 % (ref 0.4–3.1)

## 2022-05-27 LAB — BASIC METABOLIC PANEL
Anion gap: 5 (ref 5–15)
BUN: 11 mg/dL (ref 6–20)
CO2: 23 mmol/L (ref 22–32)
Calcium: 8.9 mg/dL (ref 8.9–10.3)
Chloride: 109 mmol/L (ref 98–111)
Creatinine, Ser: 1 mg/dL (ref 0.61–1.24)
GFR, Estimated: >60 mL/min (ref >60)
Glucose, Bld: 78 mg/dL (ref 70–99)
Potassium: 4 mmol/L (ref 3.5–5.1)
Sodium: 137 mmol/L (ref 135–145)

## 2022-05-27 MED ORDER — IOHEXOL 350 MG/ML SOLN
75.0000 mL | Freq: Once | INTRAVENOUS | Status: AC | PRN
  Administered 2022-05-27: 75 mL via INTRAVENOUS

## 2022-05-27 NOTE — ED Triage Notes (Signed)
Pt states he was referred to ED from hematology d/t hx of sickle cell. Pt states that he is not having any pain and is not having a pain crisis, but feels as though he is getting tired more easily and having some DOE. Pt also states his digital clubbing is worse in recent days.

## 2022-05-27 NOTE — ED Provider Notes (Signed)
[place] EMERGENCY DEPARTMENT AT [facility] Provider Note   CSN: [id number] Arrival date & time: 05/27/22  1225     History  Chief Complaint  Patient presents with   Fatigue    Marcus Ross is a 22 y.o. male.  Patient presents to the emergency department complaining of shortness of breath with exertion.  Patient states that he has sickle cell disease.  He states that playing basketball this week he would get short of breath after playing for approximately 5 minutes.  Patient also endorses worsening clubbing over the past few days.  He denies chest pain, abdominal pain, any other pain at this time.  He states that his hematologist recommended he get evaluated with lab work at the emergency department.  Past medical history significant for sickle cell anemia.  HPI     Home Medications Prior to Admission medications   Medication Sig Start Date End Date Taking? Authorizing Provider  famotidine (PEPCID) 20 MG tablet Take 1 tablet (20 mg total) by mouth 2 (two) times daily. 12/17/20  Yes Wallis Bamberg, PA-C  tiZANidine (ZANAFLEX) 2 MG tablet Take 0.25-1 mg by mouth 4 (four) times daily as needed for muscle spasms. 04/15/22  Yes [provider]      Allergies    Patient has no known allergies.    Review of Systems   Review of Systems  Physical Exam Updated Vital Signs BP 114/73   Pulse (!) 57   Temp 98 F (36.7 C) (Oral)   Resp (!) 21   SpO2 99%  Physical Exam Vitals and nursing note reviewed.  Constitutional:      General: He is not in acute distress.    Appearance: He is well-developed.  HENT:     Head: Normocephalic and atraumatic.  Eyes:     Conjunctiva/sclera: Conjunctivae normal.  Cardiovascular:     Rate and Rhythm: Normal rate and regular rhythm.     Heart sounds: No murmur heard. Pulmonary:     Effort: Pulmonary effort is normal. No respiratory distress.     Breath sounds: Normal breath sounds.  Abdominal:     Palpations: Abdomen is soft.      Tenderness: There is no abdominal tenderness.  Musculoskeletal:        General: No swelling.     Cervical back: Neck supple.     Comments: Mild clubbing noted in patient's fingertips  Skin:    General: Skin is warm and dry.     Capillary Refill: Capillary refill takes less than 2 seconds.  Neurological:     Mental Status: He is alert.  Psychiatric:        Mood and Affect: Mood normal.     ED Results / Procedures / Treatments   Labs (all labs ordered are listed, but only abnormal results are displayed) Labs Reviewed  CBC WITH DIFFERENTIAL/PLATELET - Abnormal; Notable for the following components:      Result Value   HCT 38.8 (*)    MCV 66.9 (*)    MCH 22.9 (*)    RDW 16.5 (*)    Platelets 133 (*)    All other components within normal limits  RETICULOCYTES - Abnormal; Notable for the following components:   Immature Retic Fract 16.7 (*)    All other components within normal limits  BASIC METABOLIC PANEL    EKG None  Radiology DG Chest Portable 1 View  Result Date: 05/27/2022 CLINICAL DATA:  Shortness of breath EXAM: PORTABLE CHEST 1 VIEW COMPARISON:  05/22/2019 FINDINGS: The heart size and mediastinal contours are within normal limits. Both lungs are clear. The visualized skeletal structures are unremarkable. IMPRESSION: Normal study. Electronically Signed   By: Charlett Nose M.D.   On: 05/27/2022 14:07    Procedures Procedures    Medications Ordered in ED Medications - No data to display  ED Course/ Medical Decision Making/ A&P Clinical Course as of 05/27/22 1611  Thu May 27, 2022  1458 S, hx SS, SOB with exertion, follow up CT study, if negative DC home [JD]    Clinical Course User Index [JD] Fulton Reek, MD                             Medical Decision Making Amount and/or Complexity of Data Reviewed Labs: ordered. Radiology: ordered.   Patient presents with a chief complaint of shortness of breath on exertion.  Differential diagnosis includes  but is not limited to sickle cell crises, acute chest, deconditioning, anemia, others  I ordered and reviewed labs including CBC with differential, reticulocytes, and BMP.  Labs were grossly unremarkable  I ordered and interpreted imaging including a chest x-ray.  No acute disease noted.  I agree with the radiologist findings.  Upon further review of patient's vitals it was noted that patient had been tachycardic upon arrival and intermittently tachycardic since arrival.  Based on patient's shortness of breath, history of sickle cell disease, felt that it was necessary for a PE study.  CT PE study ordered.  Patient care being transferred to Dr. Earlene Plater at shift handoff.  Laboratory work is grossly unremarkable.  If CT PE study is negative plan to discharge home with recommendations for follow-up with patient's hematologist.  Disposition pending results of CT PE study.        Final Clinical Impression(s) / ED Diagnoses Final diagnoses:  Shortness of breath  Sickle cell disease without crisis [facility])    Rx / DC Orders ED Discharge Orders     None         Pamala Duffel 05/27/22 1612    Arby Barrette, MD 05/31/22 1842

## 2022-05-27 NOTE — ED Provider Notes (Signed)
  Physical Exam  BP 115/76   Pulse 63   Temp 98 F (36.7 C) (Oral)   Resp (!) 22   SpO2 99%   Physical Exam Vitals reviewed.  Constitutional:      General: He is not in acute distress.    Appearance: Normal appearance.  Cardiovascular:     Rate and Rhythm: Normal rate and regular rhythm.  Pulmonary:     Effort: Pulmonary effort is normal. No respiratory distress.  Neurological:     General: No focal deficit present.     Mental Status: He is alert and oriented to person, place, and time.  Psychiatric:        Mood and Affect: Mood normal.        Behavior: Behavior normal.     Procedures  Procedures  ED Course / MDM   Clinical Course as of 05/27/22 1711  Thu May 27, 2022  1458 S, hx SS, SOB with exertion, follow up CT study, if negative DC home [JD]    Clinical Course User Index [JD] Fulton Reek, MD   Medical Decision Making Amount and/or Complexity of Data Reviewed Labs: ordered. Radiology: ordered.  Risk Prescription drug management.   On reevaluation, patient is asymptomatic.  He is hemodynamically stable.  Reports single episode of increased fatigue and shortness of breath earlier.  Workup here without anemia, electrolyte abnormality, or leukocytosis.  CT chest reviewed, no signs of PE, pulmonary edema, infection, acute chest syndrome.  They do note patulous esophagus but he is asymptomatic from the standpoint.  Unclear why he had this episode earlier, however workup here is very reassuring.  I feel he is safe for discharge home.  He has hematology follow-up on Tuesday, encouraged him to keep this appointment and monitor his symptoms over the weekend.  I told him to avoid any significant exertion until follow-up.  He was comfortable this plan.  Discharged home with mom at bedside.       Fulton Reek, MD 05/27/22 1711    Arby Barrette, MD 05/31/22 1843

## 2022-05-27 NOTE — Discharge Instructions (Addendum)
Your workup today was reassuring.  Please follow-up with your hematologist for further evaluation and management as needed.
